# Patient Record
Sex: Female | Born: 1965
Health system: Southern US, Community
[De-identification: ages and names within clinical notes are randomized; demographics above are authoritative.]

## PROBLEM LIST (undated history)

## (undated) DIAGNOSIS — E7800 Pure hypercholesterolemia, unspecified: Secondary | ICD-10-CM

## (undated) DIAGNOSIS — E789 Disorder of lipoprotein metabolism, unspecified: Secondary | ICD-10-CM

## (undated) HISTORY — PX: DILATION AND CURETTAGE OF UTERUS: SHX78

---

## 2008-02-06 ENCOUNTER — Ambulatory Visit (HOSPITAL_COMMUNITY): Admission: RE | Admit: 2008-02-06 | Discharge: 2008-02-06 | Payer: Self-pay | Admitting: Obstetrics and Gynecology

## 2008-02-20 ENCOUNTER — Ambulatory Visit (HOSPITAL_COMMUNITY): Admission: RE | Admit: 2008-02-20 | Discharge: 2008-02-20 | Payer: Self-pay | Admitting: Obstetrics and Gynecology

## 2008-03-12 ENCOUNTER — Ambulatory Visit (HOSPITAL_COMMUNITY): Admission: RE | Admit: 2008-03-12 | Discharge: 2008-03-12 | Payer: Self-pay | Admitting: Obstetrics and Gynecology

## 2008-04-09 ENCOUNTER — Ambulatory Visit (HOSPITAL_COMMUNITY): Admission: RE | Admit: 2008-04-09 | Discharge: 2008-04-09 | Payer: Self-pay | Admitting: Obstetrics and Gynecology

## 2008-05-07 ENCOUNTER — Ambulatory Visit (HOSPITAL_COMMUNITY): Admission: RE | Admit: 2008-05-07 | Discharge: 2008-05-07 | Payer: Self-pay | Admitting: Obstetrics and Gynecology

## 2008-05-25 ENCOUNTER — Inpatient Hospital Stay (HOSPITAL_COMMUNITY): Admission: AD | Admit: 2008-05-25 | Discharge: 2008-05-28 | Payer: Self-pay | Admitting: Obstetrics and Gynecology

## 2008-05-26 ENCOUNTER — Encounter: Payer: Self-pay | Admitting: Obstetrics and Gynecology

## 2009-08-16 ENCOUNTER — Emergency Department (HOSPITAL_BASED_OUTPATIENT_CLINIC_OR_DEPARTMENT_OTHER): Admission: EM | Admit: 2009-08-16 | Discharge: 2009-08-16 | Payer: Self-pay | Admitting: Emergency Medicine

## 2010-01-16 ENCOUNTER — Emergency Department (HOSPITAL_BASED_OUTPATIENT_CLINIC_OR_DEPARTMENT_OTHER)
Admission: EM | Admit: 2010-01-16 | Discharge: 2010-01-16 | Payer: Self-pay | Source: Home / Self Care | Admitting: Emergency Medicine

## 2010-02-01 ENCOUNTER — Encounter: Payer: Self-pay | Admitting: Obstetrics and Gynecology

## 2010-02-02 ENCOUNTER — Encounter: Payer: Self-pay | Admitting: Obstetrics and Gynecology

## 2010-04-21 LAB — CBC
MCHC: 33.8 g/dL (ref 30.0–36.0)
MCV: 86.5 fL (ref 78.0–100.0)
Platelets: 258 10*3/uL (ref 150–400)
Platelets: 271 10*3/uL (ref 150–400)
WBC: 10.9 10*3/uL — ABNORMAL HIGH (ref 4.0–10.5)
WBC: 10.9 10*3/uL — ABNORMAL HIGH (ref 4.0–10.5)

## 2010-04-21 LAB — RPR: RPR Ser Ql: NONREACTIVE

## 2010-07-30 IMAGING — US US OB FOLLOW-UP
1 series · 14 of 28 positions shown · non-contrast
Comparison: none

OBSTETRICAL ULTRASOUND:
 This ultrasound was performed in The [HOSPITAL], and the [facility] report will be stored to [REDACTED] PACS.

[Series 1: us ob follow-up · 14 of 43 slices shown]
[im 2/43]
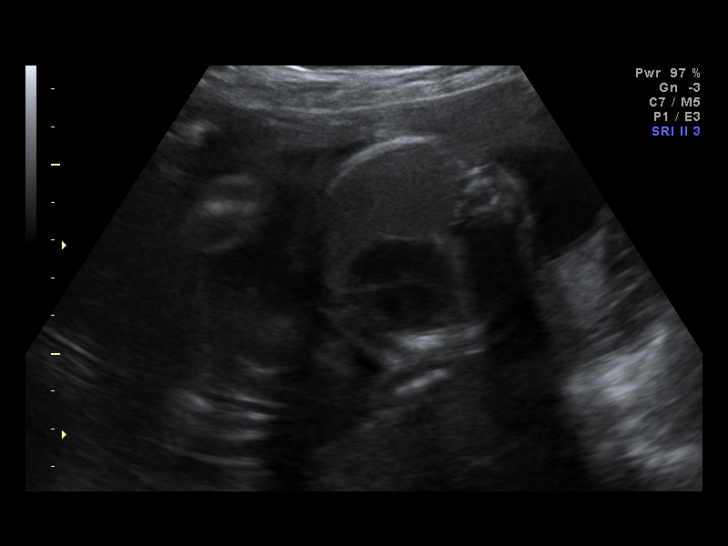
[im 5/43]
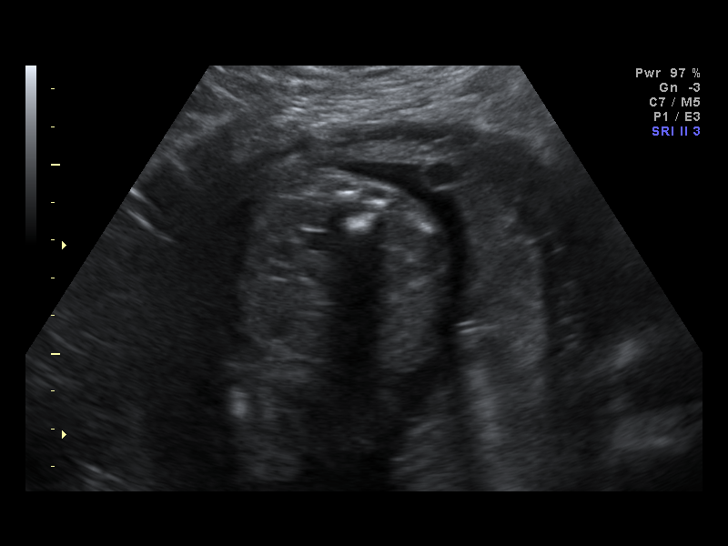
[im 8/43]
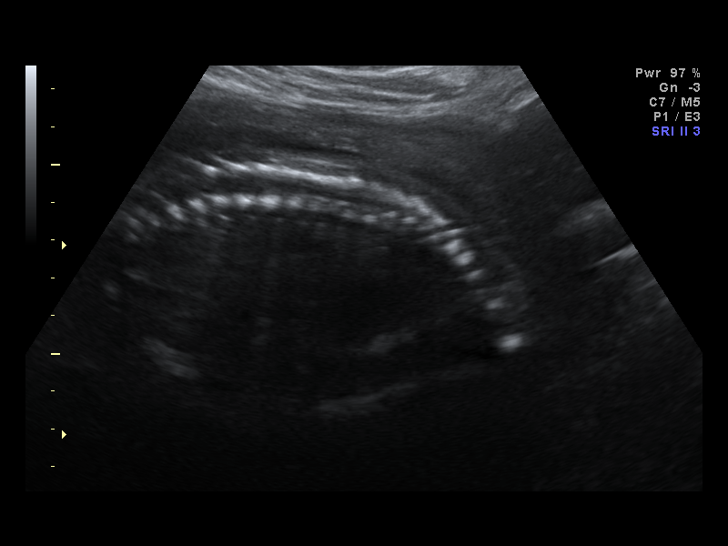
[im 11/43]
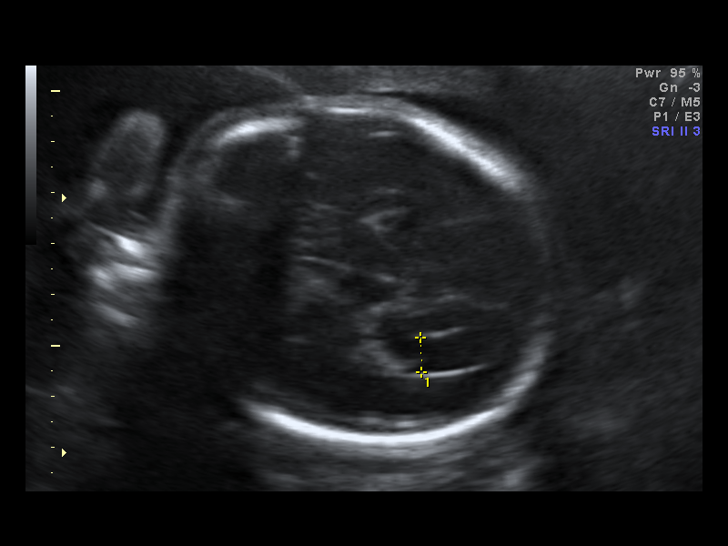
[im 15/43]
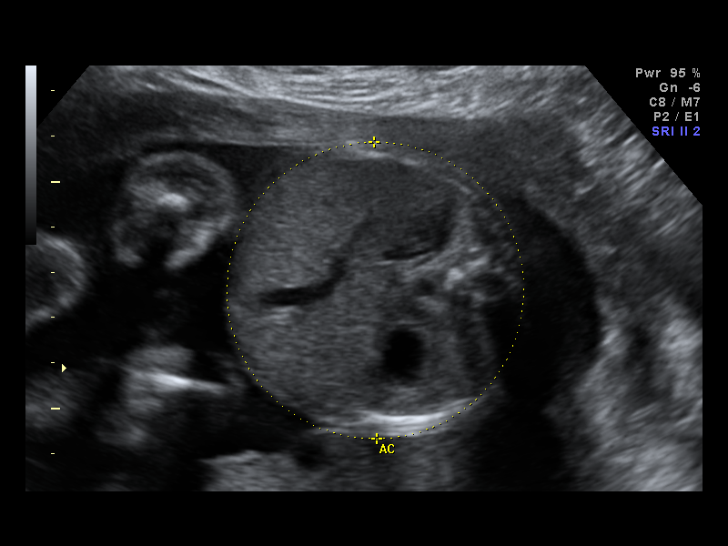
[im 18/43]
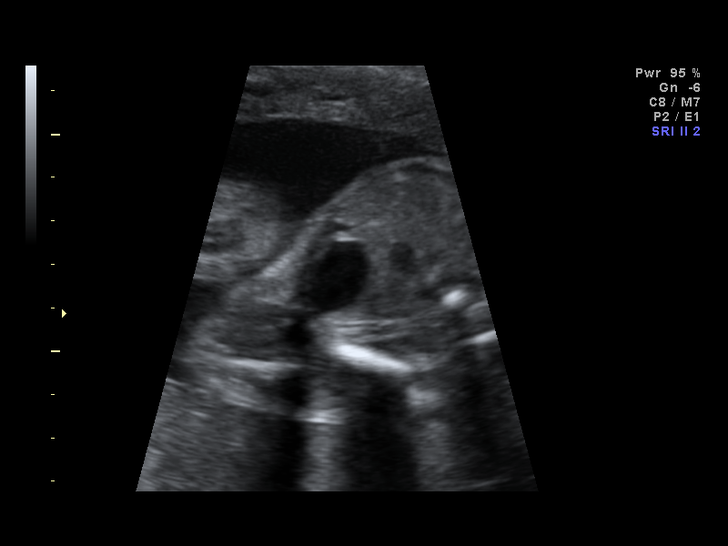
[im 21/43]
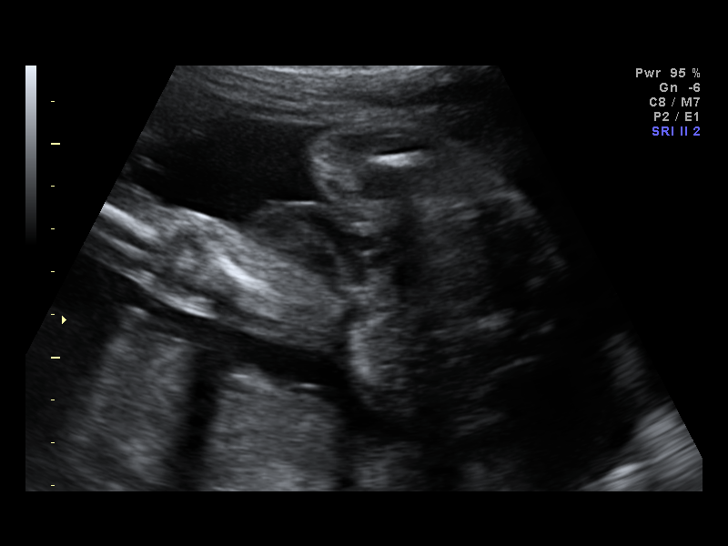
[im 24/43]
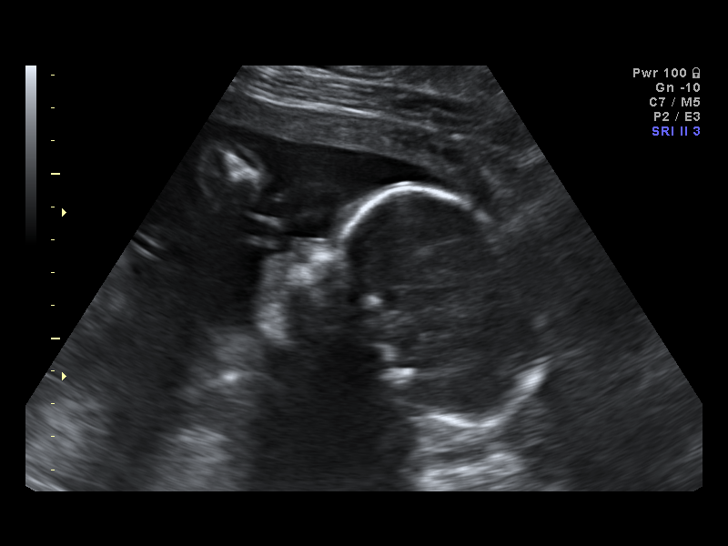
[im 27/43]
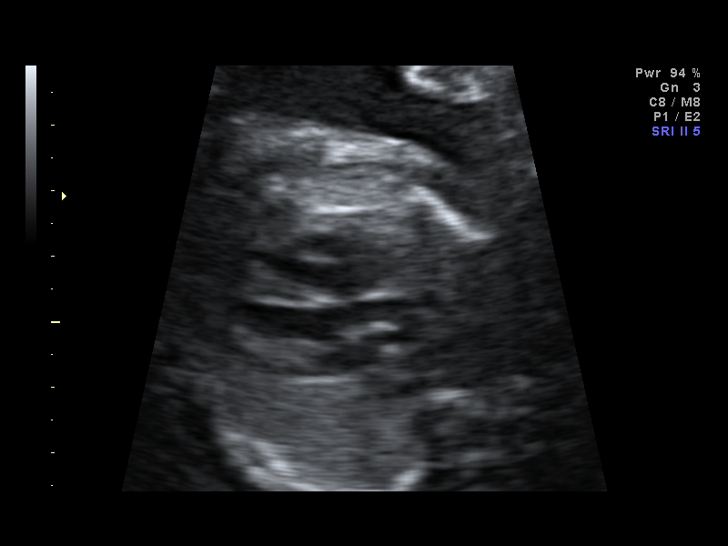
[im 30/43]
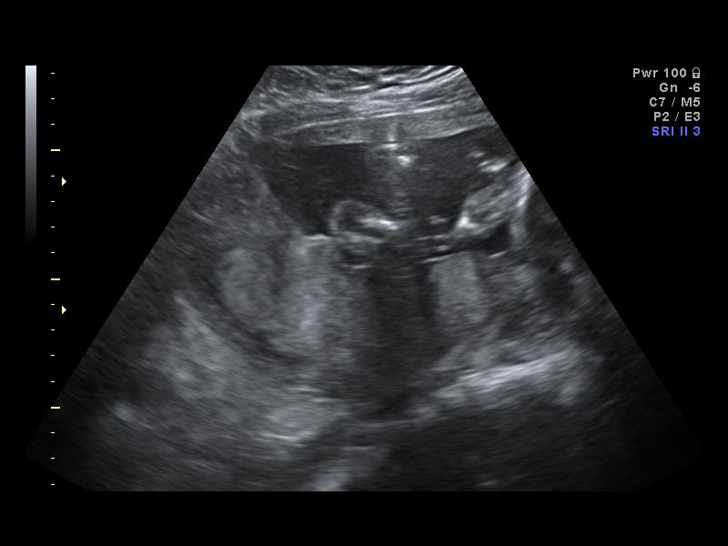
[im 33/43]
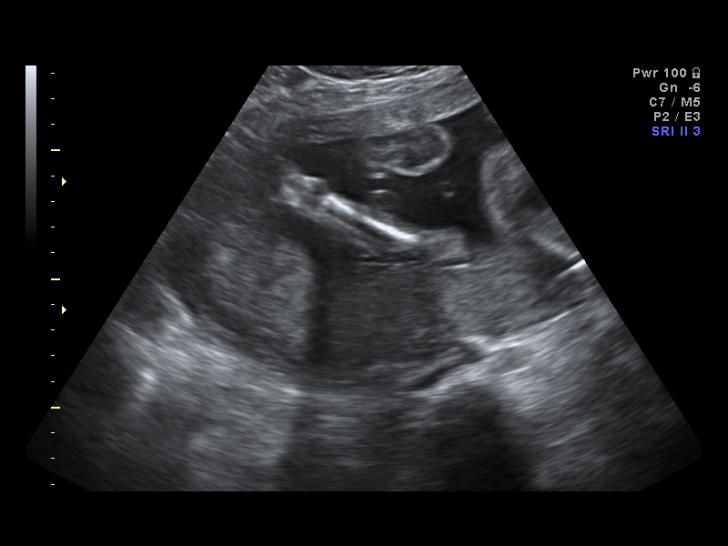
[im 36/43]
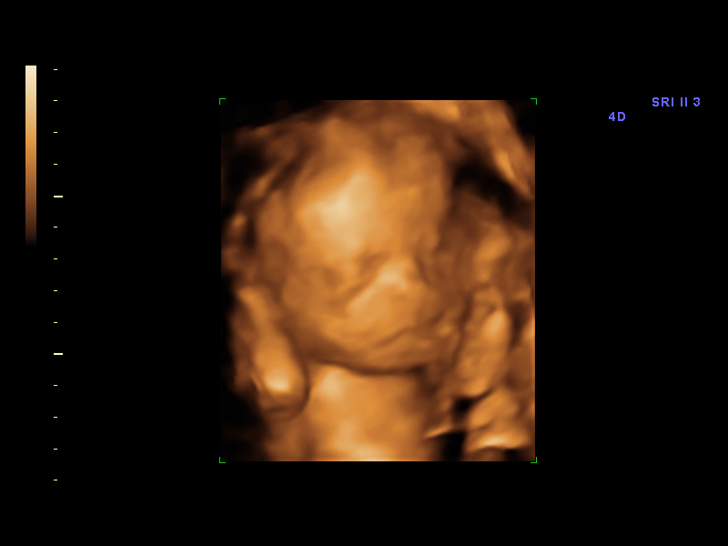
[im 39/43]
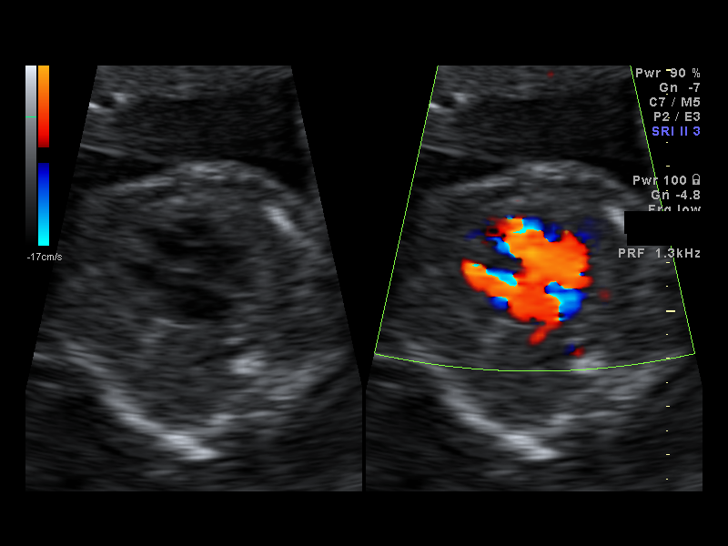
[im 43/43]
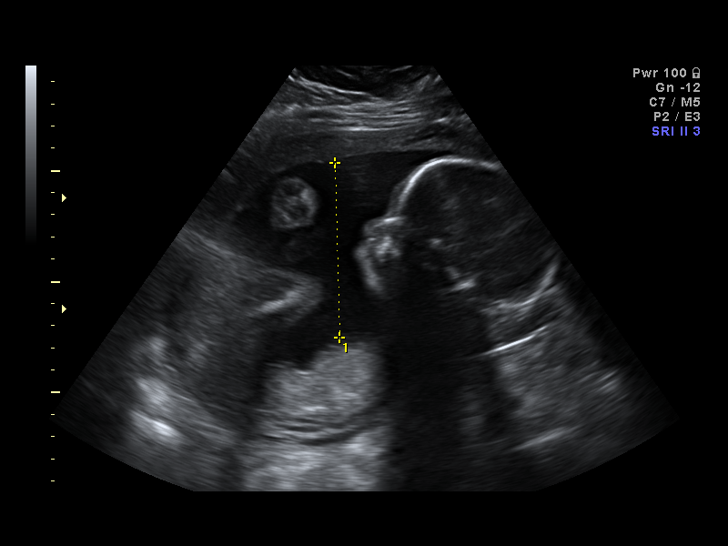

[14 of 28 positions shown; findings below may reference images not displayed]

IMPRESSION: [facility] has also been faxed to the ordering physician.

## 2010-10-30 ENCOUNTER — Inpatient Hospital Stay (HOSPITAL_COMMUNITY): Payer: Medicaid Other

## 2010-10-30 ENCOUNTER — Encounter (HOSPITAL_COMMUNITY): Payer: Self-pay | Admitting: *Deleted

## 2010-10-30 ENCOUNTER — Inpatient Hospital Stay (HOSPITAL_COMMUNITY)
Admission: AD | Admit: 2010-10-30 | Discharge: 2010-10-30 | Disposition: A | Discharge status: Home / Self Care | Payer: Medicaid Other | Source: Ambulatory Visit | Attending: Obstetrics & Gynecology | Admitting: Obstetrics & Gynecology

## 2010-10-30 DIAGNOSIS — N390 Urinary tract infection, site not specified: Secondary | ICD-10-CM

## 2010-10-30 DIAGNOSIS — R1031 Right lower quadrant pain: Secondary | ICD-10-CM | POA: Insufficient documentation

## 2010-10-30 DIAGNOSIS — Z30431 Encounter for routine checking of intrauterine contraceptive device: Secondary | ICD-10-CM | POA: Insufficient documentation

## 2010-10-30 DIAGNOSIS — R1032 Left lower quadrant pain: Secondary | ICD-10-CM | POA: Insufficient documentation

## 2010-10-30 HISTORY — DX: Disorder of lipoprotein metabolism, unspecified: E78.9

## 2010-10-30 HISTORY — DX: Pure hypercholesterolemia, unspecified: E78.00

## 2010-10-30 LAB — URINALYSIS, ROUTINE W REFLEX MICROSCOPIC
Nitrite: NEGATIVE
Specific Gravity, Urine: <1.005 — ABNORMAL LOW (ref 1.005–1.030)
Urobilinogen, UA: 0.2 mg/dL (ref 0.0–1.0)

## 2010-10-30 LAB — POCT PREGNANCY, URINE: Preg Test, Ur: NEGATIVE

## 2010-10-30 LAB — CBC
MCV: 86 fL (ref 78.0–100.0)
Platelets: 240 10*3/uL (ref 150–400)
RDW: 14.3 % (ref 11.5–15.5)
WBC: 7.1 10*3/uL (ref 4.0–10.5)

## 2010-10-30 LAB — WET PREP, GENITAL
Trich, Wet Prep: NONE SEEN
Yeast Wet Prep HPF POC: NONE SEEN

## 2010-10-30 LAB — URINE MICROSCOPIC-ADD ON

## 2010-10-30 MED ORDER — SULFAMETHOXAZOLE-TRIMETHOPRIM 800-160 MG PO TABS: 1.0000 | ORAL_TABLET | Freq: Two times a day (BID) | ORAL | Status: AC

## 2010-10-30 NOTE — ED Notes (Signed)
79 M. Williams CNM in to discuss u/s results and d/c plan

## 2010-10-30 NOTE — Progress Notes (Signed)
Written and verbal d/c instructions given and understanding voiced. 

## 2010-10-30 NOTE — Progress Notes (Signed)
Pt states she has had abdominal pain for about one month. Started on the left lower abdomen but not comes and goes on both sides.

## 2010-10-30 NOTE — Progress Notes (Signed)
States pain is sometimes sharp as well as crampy

## 2010-10-30 NOTE — ED Provider Notes (Signed)
History     Chief Complaint  Patient presents with  . Abdominal Pain   HPI Presents with c/o one month history of intermittent LLQ pain which moves back and forth between RLQ and LLQ.  Describes as sharp and momentary. No pattern or association with diet. Denies constipation or diarrhea. Denies bleeding problems. Has Mirena with light bleeding pattern. Mirena placed by Dr Dion Body, but pt dismissed from practice for nonpayment and has had no exam for 2 years.  States no intercourse for 2 years.    Past Medical History  Diagnosis Date  . Cholesterol serum increased     Past Surgical History  Procedure Date  . Dilation and curettage of uterus   . No past surgeries     History reviewed. No pertinent family history.  History  Substance Use Topics  . Smoking status: Current Everyday Smoker -- 0.5 packs/day  . Smokeless tobacco: Never Used  . Alcohol Use: 0.6 oz/week    1 Glasses of wine per week    Allergies: No Known Allergies  Prescriptions prior to admission  Medication Sig Dispense Refill  . ibuprofen (ADVIL,MOTRIN) 200 MG tablet Take 600 mg by mouth every 6 (six) hours as needed. Takes for pain       . phentermine 37.5 MG capsule Take 37.5 mg by mouth every morning.        . simvastatin (ZOCOR) 20 MG tablet Take 20 mg by mouth at bedtime.          Review of Systems  Constitutional: Negative for fever, chills and malaise/fatigue.  Gastrointestinal: Positive for abdominal pain. Negative for heartburn, nausea, vomiting, diarrhea and constipation.       No anorexia. Is on Phentermine for wt loss  Genitourinary: Negative for dysuria.   Physical Exam   Blood pressure 118/79, pulse 93, temperature 99.1 F (37.3 C), temperature source Oral, resp. rate 20, height 5' 6.75" (1.695 m), weight 239 lb (108.41 kg), last menstrual period 04/30/2010, SpO2 97.00%.  Physical Exam  Constitutional: She is oriented to person, place, and time. She appears well-developed and  well-nourished. No distress.       Talking on cellphone  HENT:  Head: Normocephalic.  Respiratory: Effort normal.  GI: Soft. She exhibits no distension and no mass. There is no tenderness. There is no rebound and no guarding.       Obese abdomen limits exam  Genitourinary: Uterus normal. Vaginal discharge found.       No lesions or bleeding. IUD string visible. Uterus nontender. Adnexae nontender. Obese abdomen limits complete exam.  Neurological: She is alert and oriented to person, place, and time.  Skin: Skin is warm and dry.  Psychiatric: She has a normal mood and affect.   UA:  Large Leuk with some bacteria US Transvaginal Non-ob   10/30/2010  *RADIOLOGY REPORT*  Clinical Data: Intermittent right lower quadrant pain and left lower quadrant pain.  Renal IUD.  LMP unknown.  TRANSABDOMINAL AND TRANSVAGINAL ULTRASOUND OF PELVIS Technique:  Both transabdominal and transvaginal ultrasound examinations of the pelvis were performed. Transabdominal technique was performed for global imaging of the pelvis including uterus, ovaries, adnexal regions, and pelvic cul-de-sac.  Comparison: None.   It was necessary to proceed with endovaginal exam following the transabdominal exam to visualize the ovaries.  Findings:  Uterus: Uterus is retroverted and measures 7.4 x 5.2 x 5.7 cm. Small focal calcifications in the midline uterine fundus are noted, measuring 8 mm maximal diameter.  This could be a small calcified  submucosal fibroid.  Otherwise, the uterus is homogeneous.  Endometrium: Normal in thickness and appearance.  Measures 3 mm. Intrauterine device is satisfactorily positioned in the endometrial canal.  Right ovary:  Normal appearance/no adnexal mass.  Left ovary: Normal appearance/no adnexal mass.  Small focal calcification noted within the left ovary, of doubtful significance.  Other findings: A 1.7 x 1.7 x 1.2 cm simple cyst is noted within the vagina (less likely a somewhat exophytic of the cervical  Nabothian cysts.  When imaging this area the sonographer question the patient effect was painful, and she denied pain in this region. This may be a Gartner duct cyst.  IMPRESSION:  1.  Satisfactory position of intrauterine device. 2.  Small submucosal fundal calcifications may be a small calcified uterine fibroids.intrauterine device. 3.  Ovaries within normal limits. 3.  1.7 cm cyst in the vagina is likely a Gartner's duct cyst. Alternatively, this could be a large benign Nabothian cyst extending from the cervix.  The patient was asymptomatic with imaging this area.  Original Report Authenticated By: Britta Mccreedy, M.D.   MAU Course  Procedures   Assessment and Plan  A:  Intermittent lower abdominal pain Mirena IUD Probably UTI   P:  WIll check CBC, pelvic US  [facility] 10/30/2010, 5:02 PM   Normal ultrasound. Normal CBC Will treat UTI presumptively Discussed findings. Pt to go to primary doctor for GI eval.

## 2010-10-31 NOTE — ED Provider Notes (Signed)
Agree with above note.  LEGGETT,KELLY H. 10/31/2010 5:46 AM

## 2011-09-25 ENCOUNTER — Emergency Department (HOSPITAL_BASED_OUTPATIENT_CLINIC_OR_DEPARTMENT_OTHER): Payer: Medicaid Other

## 2011-09-25 ENCOUNTER — Emergency Department (HOSPITAL_BASED_OUTPATIENT_CLINIC_OR_DEPARTMENT_OTHER)
Admission: EM | Admit: 2011-09-25 | Discharge: 2011-09-25 | Disposition: A | Discharge status: Home / Self Care | Payer: Medicaid Other | Attending: Emergency Medicine | Admitting: Emergency Medicine

## 2011-09-25 ENCOUNTER — Encounter (HOSPITAL_BASED_OUTPATIENT_CLINIC_OR_DEPARTMENT_OTHER): Payer: Self-pay | Admitting: *Deleted

## 2011-09-25 DIAGNOSIS — F17200 Nicotine dependence, unspecified, uncomplicated: Secondary | ICD-10-CM | POA: Insufficient documentation

## 2011-09-25 DIAGNOSIS — S93409A Sprain of unspecified ligament of unspecified ankle, initial encounter: Secondary | ICD-10-CM | POA: Insufficient documentation

## 2011-09-25 DIAGNOSIS — X500XXA Overexertion from strenuous movement or load, initial encounter: Secondary | ICD-10-CM | POA: Insufficient documentation

## 2011-09-25 DIAGNOSIS — Y9239 Other specified sports and athletic area as the place of occurrence of the external cause: Secondary | ICD-10-CM | POA: Insufficient documentation

## 2011-09-25 MED ORDER — HYDROCODONE-ACETAMINOPHEN 5-325 MG PO TABS: 2.0000 | ORAL_TABLET | ORAL | Status: AC | PRN

## 2011-09-25 MED ORDER — HYDROCODONE-ACETAMINOPHEN 5-325 MG PO TABS
2.0000 | ORAL_TABLET | Freq: Once | ORAL | Status: AC
  Administered 2011-09-25: 2 via ORAL
  Filled 2011-09-25: qty 2

## 2011-09-25 NOTE — ED Provider Notes (Signed)
History     CSN: [id number]  Arrival date & time 09/25/11  1945   First MD Initiated Contact with Patient 09/25/11 2118      Chief Complaint  Patient presents with  . Ankle Injury    (Consider location/radiation/quality/duration/timing/severity/associated sxs/prior treatment) Patient is a 46 y.o. female presenting with ankle pain. The history is provided by the patient. No language interpreter was used.  Ankle Pain  The incident occurred less than 1 hour ago. [place] The injury mechanism was torsion. The pain is present in the right ankle. The quality of the pain is described as aching. The pain is at a severity of 6/10. The pain is moderate. The pain has been constant since onset. Pertinent negatives include no inability to bear weight. She has tried nothing for the symptoms. The treatment provided no relief.  [place] and [place].    Past Medical History  Diagnosis Date  . Cholesterol serum increased     Past Surgical History  Procedure Date  . Dilation and curettage of uterus   . No past surgeries     History reviewed. No pertinent family history.  History  Substance Use Topics  . Smoking status: Current Every Day Smoker -- 0.5 packs/day  . Smokeless tobacco: Never Used  . Alcohol Use: 0.6 oz/week    1 Glasses of wine per week    OB History    Grav Para Term Preterm Abortions TAB SAB Ect Mult Living   3 2 2  1  0 1  1 3       Review of Systems  Musculoskeletal: Positive for joint swelling.  All other systems reviewed and are negative.    Allergies  Review of patient's allergies indicates no known allergies.  Home Medications   Current Outpatient Rx  Name Route Sig Dispense Refill  . IBUPROFEN 200 MG PO TABS Oral Take 400 mg by mouth every 6 (six) hours as needed. Takes for pain      BP 114/79  Pulse 76  Temp 98.1 F (36.7 C) (Oral)  Resp 20  Ht 5' 6.5" (1.689 m)  Wt 238 lb  (107.956 kg)  BMI 37.84 kg/m2  SpO2 100%  Physical Exam  Vitals reviewed. Constitutional: She appears well-developed and well-nourished.  HENT:  Head: Normocephalic and atraumatic.  Musculoskeletal: She exhibits tenderness.       Tender right ankle, swollen,  nv and ns intact  Skin: Skin is warm.    ED Course  Procedures (including critical care time)  Labs Reviewed - No data to display No results found.   No diagnosis found.    MDM  Pt placed in aso and on crutches.   I advised see Dr. Pearletha Forge for recheck in 1 week        Lonia Skinner [place], [place] 09/25/11 2205

## 2011-09-25 NOTE — ED Notes (Signed)
Patient transported to X-ray 

## 2011-09-25 NOTE — ED Notes (Signed)
Pt states she turned her right ankle earlier today. Swelling noted. +dpp palp. Moves toes. Feels touch. Cap refill < 3 sec Ice applied.

## 2011-09-25 NOTE — ED Notes (Signed)
Returned from xray

## 2011-09-26 NOTE — ED Provider Notes (Signed)
Medical screening examination/treatment/procedure(s) were performed by non-physician practitioner and as supervising physician I was immediately available for consultation/collaboration.  Zachory Mangual, MD 09/26/11 0051 

## 2011-10-05 ENCOUNTER — Ambulatory Visit: Payer: Medicaid Other | Admitting: Family Medicine

## 2011-10-08 ENCOUNTER — Ambulatory Visit: Payer: Medicaid Other | Admitting: Family Medicine

## 2011-10-11 ENCOUNTER — Ambulatory Visit (INDEPENDENT_AMBULATORY_CARE_PROVIDER_SITE_OTHER): Payer: Medicaid Other | Admitting: Family Medicine

## 2011-10-11 ENCOUNTER — Encounter: Payer: Self-pay | Admitting: Family Medicine

## 2011-10-11 VITALS — BP 107/77 | HR 86 | Ht 67.0 in | Wt 238.0 lb

## 2011-10-11 DIAGNOSIS — S99911A Unspecified injury of right ankle, initial encounter: Secondary | ICD-10-CM

## 2011-10-11 DIAGNOSIS — S8990XA Unspecified injury of unspecified lower leg, initial encounter: Secondary | ICD-10-CM

## 2011-10-11 NOTE — Patient Instructions (Addendum)
You have an ankle sprain. Ice the area for 15 minutes at a time, 3-4 times a day as needed. Take aleve 1-2 tabs twice a day with food for 1 week then as needed. Norco every 6 hours as needed for pain - no driving on this medication. Elevate above the level of your heart when possible Activities as tolerated. Wear ankle brace as much as possible when up and walking around for next 4 weeks then as needed. Come out of the brace twice a day to do Up/down and alphabet exercises 2-3 sets of each. Theraband exercises 3 sets of 10 each direction - start these in 1 week. Consider physical therapy for strengthening and balance exercises if not improving as expected. If not improving as expected, we may repeat x-rays or consider further testing like an MRI. Follow up with me in 4 weeks or as needed.

## 2011-10-12 DIAGNOSIS — S99911A Unspecified injury of right ankle, initial encounter: Secondary | ICD-10-CM | POA: Insufficient documentation

## 2011-10-13 ENCOUNTER — Encounter: Payer: Self-pay | Admitting: Family Medicine

## 2011-10-13 NOTE — Progress Notes (Signed)
  Subjective:    Patient ID: Shelly Washington, female    DOB: 24-Jun-1965, 46 y.o.   MRN: [id number]  PCP: Dr. Julio Sicks  HPI 46 yo F here for right ankle injury.  Patient reports [place] 9[place]16 [place] - walking from car she inverted her right ankle. Difficulty bearing weight after this - stayed on ground for about 10 minutes. + swelling, some bruising. No prior ankle injuries. Went to ED and had x-rays that were negative - given crutches and ASO. Given vicodin and has been using ice pack. Has improved some but still cannot jog.  Past Medical History  Diagnosis Date  . Cholesterol serum increased     Current Outpatient Prescriptions on File Prior to Visit  Medication Sig Dispense Refill  . ibuprofen (ADVIL,MOTRIN) 200 MG tablet Take 400 mg by mouth every 6 (six) hours as needed. Takes for pain        Past Surgical History  Procedure Date  . Dilation and curettage of uterus     No Known Allergies  History   Social History  . Marital Status: Legally Separated    Spouse Name: N/A    Number of Children: N/A  . Years of Education: N/A   Occupational History  . Not on file.   Social History Main Topics  . Smoking status: Current Every Day Smoker -- 0.5 packs/day  . Smokeless tobacco: Never Used  . Alcohol Use: 0.6 oz/week    1 Glasses of wine per week  . Drug Use: No  . Sexually Active: No   Other Topics Concern  . Not on file   Social History Narrative  . No narrative on file    Family History  Problem Relation Age of Onset  . Hypertension Mother   . Diabetes Neg Hx   . Heart attack Neg Hx   . Hyperlipidemia Neg Hx   . Sudden death Neg Hx     BP 107/77  Pulse 86  Ht 5\' 7"  (1.702 m)  Wt 238 lb (107.956 kg)  BMI 37.28 kg/m2  Review of Systems See HPI above.    Objective:   Physical Exam Gen: NAD  R ankle: Mild swelling laterally.  No bruising, other deformity. FROM with 5/5 strength all directions without pain. TTP at ATFL  mildly.  No malleolar, base 5th, navicular, fibular head, other foot/ankle TTP. Trace ant drawer and talar tilt. Negative syndesmotic compression. Thompsons test negative. NV intact distally.    Assessment & Plan:  1. Right ankle sprain - Reassured.  Radiographs negative.  Icing, nsaids with norco as needed - rx #40 for evening and bedtime as needed for severe pain.  Continue aso with ambulation.  Start ROM and theraband strengthening exercises - handout provided.  See instructions for further.  F/u in 4 weeks or prn.

## 2011-10-13 NOTE — Assessment & Plan Note (Signed)
Right ankle sprain - Reassured.  Radiographs negative.  Icing, nsaids with norco as needed - rx #40 for evening and bedtime as needed for severe pain.  Continue aso with ambulation.  Start ROM and theraband strengthening exercises - handout provided.  See instructions for further.  F/u in 4 weeks or prn.

## 2013-11-12 ENCOUNTER — Encounter: Payer: Self-pay | Admitting: Family Medicine

## 2015-05-22 ENCOUNTER — Emergency Department (HOSPITAL_BASED_OUTPATIENT_CLINIC_OR_DEPARTMENT_OTHER)
Admission: EM | Admit: 2015-05-22 | Discharge: 2015-05-22 | Disposition: A | Discharge status: Home / Self Care | Payer: Commercial Managed Care - HMO | Attending: Emergency Medicine | Admitting: Emergency Medicine

## 2015-05-22 ENCOUNTER — Encounter (HOSPITAL_BASED_OUTPATIENT_CLINIC_OR_DEPARTMENT_OTHER): Payer: Self-pay

## 2015-05-22 ENCOUNTER — Emergency Department (HOSPITAL_BASED_OUTPATIENT_CLINIC_OR_DEPARTMENT_OTHER): Payer: Commercial Managed Care - HMO

## 2015-05-22 DIAGNOSIS — M545 Low back pain, unspecified: Secondary | ICD-10-CM

## 2015-05-22 DIAGNOSIS — M549 Dorsalgia, unspecified: Secondary | ICD-10-CM | POA: Diagnosis present

## 2015-05-22 DIAGNOSIS — F17200 Nicotine dependence, unspecified, uncomplicated: Secondary | ICD-10-CM | POA: Diagnosis not present

## 2015-05-22 LAB — COMPREHENSIVE METABOLIC PANEL
ALBUMIN: 3.9 g/dL (ref 3.5–5.0)
ALK PHOS: 56 U/L (ref 38–126)
ALT: 10 U/L — AB (ref 14–54)
AST: 15 U/L (ref 15–41)
Anion gap: 2 — ABNORMAL LOW (ref 5–15)
BILIRUBIN TOTAL: 0.5 mg/dL (ref 0.3–1.2)
BUN: 7 mg/dL (ref 6–20)
CO2: 27 mmol/L (ref 22–32)
CREATININE: 0.85 mg/dL (ref 0.44–1.00)
Calcium: 8.6 mg/dL — ABNORMAL LOW (ref 8.9–10.3)
Chloride: 105 mmol/L (ref 101–111)
GFR calc Af Amer: >60 mL/min (ref >60)
GFR calc non Af Amer: >60 mL/min (ref >60)
GLUCOSE: 83 mg/dL (ref 65–99)
POTASSIUM: 3.7 mmol/L (ref 3.5–5.1)
Sodium: 134 mmol/L — ABNORMAL LOW (ref 135–145)
TOTAL PROTEIN: 7 g/dL (ref 6.5–8.1)

## 2015-05-22 LAB — CBC WITH DIFFERENTIAL/PLATELET
BASOS ABS: 0 10*3/uL (ref 0.0–0.1)
Basophils Relative: 1 %
EOS ABS: 0.1 10*3/uL (ref 0.0–0.7)
EOS PCT: 1 %
HCT: 39.1 % (ref 36.0–46.0)
Hemoglobin: 12.6 g/dL (ref 12.0–15.0)
LYMPHS PCT: 41 %
Lymphs Abs: 2.4 10*3/uL (ref 0.7–4.0)
MCH: 27.7 pg (ref 26.0–34.0)
MCHC: 32.2 g/dL (ref 30.0–36.0)
MCV: 85.9 fL (ref 78.0–100.0)
MONO ABS: 0.4 10*3/uL (ref 0.1–1.0)
Monocytes Relative: 6 %
Neutro Abs: 3 10*3/uL (ref 1.7–7.7)
Neutrophils Relative %: 51 %
PLATELETS: 256 10*3/uL (ref 150–400)
RBC: 4.55 MIL/uL (ref 3.87–5.11)
RDW: 14.1 % (ref 11.5–15.5)
WBC: 5.9 10*3/uL (ref 4.0–10.5)

## 2015-05-22 LAB — URINALYSIS, ROUTINE W REFLEX MICROSCOPIC
BILIRUBIN URINE: NEGATIVE
Glucose, UA: NEGATIVE mg/dL
KETONES UR: NEGATIVE mg/dL
Leukocytes, UA: NEGATIVE
NITRITE: NEGATIVE
PROTEIN: NEGATIVE mg/dL
Specific Gravity, Urine: 1.005 (ref 1.005–1.030)
pH: 6.5 (ref 5.0–8.0)

## 2015-05-22 LAB — URINE MICROSCOPIC-ADD ON

## 2015-05-22 LAB — PREGNANCY, URINE: PREG TEST UR: NEGATIVE

## 2015-05-22 NOTE — Discharge Instructions (Signed)
The cause of your back pain was not identified today. You had a CT scan performed that demonstrated gallstones or gallbladder. You can take ibuprofen, available over-the-counter as needed for pain.   Back Pain, Adult Back pain is very common in adults.The cause of back pain is rarely dangerous and the pain often gets better over time.The cause of your back pain may not be known. Some common causes of back pain include:  Strain of the muscles or ligaments supporting the spine.  Wear and tear (degeneration) of the spinal disks.  Arthritis.  Direct injury to the back. For many people, back pain may return. Since back pain is rarely dangerous, most people can learn to manage this condition on their own. HOME CARE INSTRUCTIONS Watch your back pain for any changes. The following actions may help to lessen any discomfort you are feeling:  Remain active. It is stressful on your back to sit or stand in one place for long periods of time. Do not sit, drive, or stand in one place for more than 30 minutes at a time. Take short walks on even surfaces as soon as you are able.Try to increase the length of time you walk each day.  Exercise regularly as directed by your health care provider. Exercise helps your back heal faster. It also helps avoid future injury by keeping your muscles strong and flexible.  Do not stay in bed.Resting more than 1-2 days can delay your recovery.  Pay attention to your body when you bend and lift. The most comfortable positions are those that put less stress on your recovering back. Always use proper lifting techniques, including:  Bending your knees.  Keeping the load close to your body.  Avoiding twisting.  Find a comfortable position to sleep. Use a firm mattress and lie on your side with your knees slightly bent. If you lie on your back, put a pillow under your knees.  Avoid feeling anxious or stressed.Stress increases muscle tension and can worsen back  pain.It is important to recognize when you are anxious or stressed and learn ways to manage it, such as with exercise.  Take medicines only as directed by your health care provider. Over-the-counter medicines to reduce pain and inflammation are often the most helpful.Your health care provider may prescribe muscle relaxant drugs.These medicines help dull your pain so you can more quickly return to your normal activities and healthy exercise.  Apply ice to the injured area:  Put ice in a plastic bag.  Place a towel between your skin and the bag.  Leave the ice on for 20 minutes, 2-3 times a day for the first 2-3 days. After that, ice and heat may be alternated to reduce pain and spasms.  Maintain a healthy weight. Excess weight puts extra stress on your back and makes it difficult to maintain good posture. SEEK MEDICAL CARE IF:  You have pain that is not relieved with rest or medicine.  You have increasing pain going down into the legs or buttocks.  You have pain that does not improve in one week.  You have night pain.  You lose weight.  You have a fever or chills. SEEK IMMEDIATE MEDICAL CARE IF:   You develop new bowel or bladder control problems.  You have unusual weakness or numbness in your arms or legs.  You develop nausea or vomiting.  You develop abdominal pain.  You feel faint.   This information is not intended to replace advice given to you by your  health care provider. Make sure you discuss any questions you have with your health care provider.   Document Released: 12/28/2004 Document Revised: 01/18/2014 Document Reviewed: 05/01/2013 Elsevier Interactive Patient Education [employer].

## 2015-05-22 NOTE — ED Provider Notes (Signed)
CSN: [id number]     Arrival date & time 05/22/15  1522 History   First MD Initiated Contact with Patient 05/22/15 1532     Chief Complaint  Patient presents with  . Back Pain    Patient is a 50 y.o. female presenting with back pain. The history is provided by the patient. No language interpreter was used.  Back Pain  Shelly Washington is a 50 y.o. female who presents to the Emergency Department complaining of back pain. She reports 3 days of bilateral mid back pain. The pain initially felt like a pulled muscle but since then it is been a constant discomfort. She has associated nausea. There is no change with any activities or movement. No fevers, vomiting, dysuria, abdominal pain, diarrhea, numbness, weakness. She has a history of duplicated left ureter.  Symptoms are moderate and constant in nature. She has not tried any medications for her symptoms.  Past Medical History  Diagnosis Date  . Cholesterol serum increased    Past Surgical History  Procedure Laterality Date  . Dilation and curettage of uterus     Family History  Problem Relation Age of Onset  . Hypertension Mother   . Diabetes Neg Hx   . Heart attack Neg Hx   . Hyperlipidemia Neg Hx   . Sudden death Neg Hx    Social History  Substance Use Topics  . Smoking status: Current Every Day Smoker -- 0.50 packs/day  . Smokeless tobacco: Never Used  . Alcohol Use: Yes     Comment: social   OB History    Gravida Para Term Preterm AB TAB SAB Ectopic Multiple Living   0 Review of Systems  Musculoskeletal: Positive for back pain.  All other systems reviewed and are negative.     Allergies  Review of patient's allergies indicates no known allergies.  Home Medications   Prior to Admission medications   Not on File   BP 120/88 mmHg  Pulse 80  Temp(Src) 99 F (37.2 C) (Oral)  Resp 18  Ht  (1.676 m)  Wt 244 lb (110.678 kg)  BMI 39.40 kg/m2  SpO2 100% Physical Exam  Constitutional:  She is oriented to person, place, and time. She appears well-developed and well-nourished.  HENT:  Head: Normocephalic and atraumatic.  Cardiovascular: Normal rate and regular rhythm.   No murmur heard. Pulmonary/Chest: Effort normal and breath sounds normal. No respiratory distress.  Abdominal: Soft. There is no tenderness. There is no rebound and no guarding.  No CVA tenderness  Musculoskeletal: She exhibits no edema or tenderness.  Neurological: She is alert and oriented to person, place, and time.  Skin: Skin is warm and dry.  Psychiatric: She has a normal mood and affect. Her behavior is normal.  Nursing note and vitals reviewed.   ED Course  Procedures (including critical care time) Labs Review Labs Reviewed  URINALYSIS, ROUTINE W REFLEX MICROSCOPIC (NOT AT [facility]) - Abnormal; Notable for the following:    Hgb urine dipstick MODERATE (*)    All other components within normal limits  URINE MICROSCOPIC-ADD ON - Abnormal; Notable for the following:    Squamous Epithelial / LPF 0-5 (*)    Bacteria, UA FEW (*)    All other components within normal limits  COMPREHENSIVE METABOLIC PANEL - Abnormal; Notable for the following:    Sodium 134 (*)    Calcium 8.6 (*)    ALT 10 (*)  Anion gap 2 (*)    All other components within normal limits  PREGNANCY, URINE  CBC WITH DIFFERENTIAL/PLATELET    Imaging Review Ct Renal Stone Study  05/22/2015  CLINICAL DATA:  Left flank pain.  Hypercholesterolemia EXAM: CT ABDOMEN AND PELVIS WITHOUT CONTRAST TECHNIQUE: Multidetector CT imaging of the abdomen and pelvis was performed following the standard protocol without IV contrast. COMPARISON:  None. FINDINGS: Lower chest and abdominal wall:  No contributory findings. Hepatobiliary: Tiny subcapsular cyst or other low-density lesion along the right liver tip.Gallbladder is full of calculi without signs of obstruction or inflammation. Pancreas: Unremarkable. Spleen: Unremarkable. Adrenals/Urinary Tract:  Negative adrenals. Duplicated collecting system on the left with atrophy of the upper pole moiety. There is an ovoid cyst along the upper vagina which is likely a Gartner cyst, no visible continuity with the upper pole moiety to suggest obstructed ectopic ureter. The distal left ureter lower pole moiety is dilated. Unremarkable bladder. Reproductive:IUD in good position. Stomach/Bowel:  No obstruction. No appendicitis. Vascular/Lymphatic: No acute vascular abnormality. No mass or adenopathy. Peritoneal: No ascites or pneumoperitoneum. Musculoskeletal: Focally advanced disc degeneration at L5-S1. IMPRESSION: 1. No acute finding. 2. 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 There is associated dilatation of the left lower moiety ureter and a vaginal cyst. 3. Cholelithiasis. Electronically Signed   By: Marnee SpringJonathon  Watts M.D.   On: 05/22/2015 17:01   I have personally reviewed and evaluated these images and lab results as part of my medical decision-making.   EKG Interpretation None      MDM   Final diagnoses:  Bilateral low back pain without sciatica  Patient here for evaluation of bilateral mid back pain. No recent injuries. She is in no distress on examination. No evidence of UTI, renal injury, renal colic. Presentation is not consistent with cauda equina or epidural abscess. Discussed with patient home care for back pain of unclear etiology. Recommend rest, ibuprofen, apply heat as needed. Return precautions were discussed.    Tilden FossaElizabeth Terricka Onofrio, MD 05/22/15 1750

## 2015-05-22 NOTE — ED Notes (Signed)
C/o mid/lower back pain x 3 days-denies injury-NAD-steady gait

## 2016-07-11 ENCOUNTER — Emergency Department (HOSPITAL_BASED_OUTPATIENT_CLINIC_OR_DEPARTMENT_OTHER)
Admission: EM | Admit: 2016-07-11 | Discharge: 2016-07-11 | Disposition: A | Discharge status: Home / Self Care | Payer: 59 | Attending: Emergency Medicine | Admitting: Emergency Medicine

## 2016-07-11 ENCOUNTER — Encounter (HOSPITAL_BASED_OUTPATIENT_CLINIC_OR_DEPARTMENT_OTHER): Payer: Self-pay | Admitting: *Deleted

## 2016-07-11 DIAGNOSIS — F17200 Nicotine dependence, unspecified, uncomplicated: Secondary | ICD-10-CM | POA: Diagnosis not present

## 2016-07-11 DIAGNOSIS — L03116 Cellulitis of left lower limb: Secondary | ICD-10-CM

## 2016-07-11 DIAGNOSIS — M79662 Pain in left lower leg: Secondary | ICD-10-CM | POA: Diagnosis present

## 2016-07-11 MED ORDER — SULFAMETHOXAZOLE-TRIMETHOPRIM 800-160 MG PO TABS
1.0000 | ORAL_TABLET | Freq: Once | ORAL | Status: AC
  Administered 2016-07-11: 1 via ORAL
  Filled 2016-07-11: qty 1

## 2016-07-11 MED ORDER — SULFAMETHOXAZOLE-TRIMETHOPRIM 800-160 MG PO TABS: 1.0000 | ORAL_TABLET | Freq: Two times a day (BID) | ORAL | 0 refills | Status: AC

## 2016-07-11 NOTE — ED Provider Notes (Signed)
[facility] Provider Note   CSN: [id number] Arrival date & time: 07/11/16  2013  By signing my name below, I, Deland PrettySherilynn Knight, attest that this documentation has been prepared under the direction and in the presence of Renne CriglerJoshua Rudransh Bellanca, PA-C. Electronically Signed: Deland PrettySherilynn Knight, ED Scribe. 07/11/16. 8:33 PM.  History   Chief Complaint Chief Complaint  Patient presents with  . Insect Bite   The history is provided by the patient. No language interpreter was used.   HPI Comments: Shelly Washington is a 51 y.o. female who presents to the Emergency Department complaining of moderate left calf pain that began yesterday. The pt has associated warmth, itching, swelling, and erythema. The pt believes she may have been bit while in working in a house in [place]. The pt has applied antibiotic cream, cleaned it with alcohol, and applied a cool compress. She also took a percocet for her pain, with some relief. The pt also notes an abrasion that occurred a few weeks ago from a mechanical fall. The pt denies fever.  Past Medical History:  Diagnosis Date  . Cholesterol serum increased     Patient Active Problem List   Diagnosis Date Noted  . Right ankle injury 10/12/2011    Past Surgical History:  Procedure Laterality Date  . DILATION AND CURETTAGE OF UTERUS      OB History    Gravida Para Term Preterm AB Living   3 2 2   1 3    SAB TAB Ectopic Multiple Live Births   1 0   1         Home Medications    Prior to Admission medications   Not on File    Family History Family History  Problem Relation Age of Onset  . Hypertension Mother   . Diabetes Neg Hx   . Heart attack Neg Hx   . Hyperlipidemia Neg Hx   . Sudden death Neg Hx     Social History Social History  Substance Use Topics  . Smoking status: Current Every Day Smoker    Packs/day: 0.50  . Smokeless tobacco: Never Used  . Alcohol use Yes     Comment: social     Allergies   Patient has no known  allergies.   Review of Systems Review of Systems  Constitutional: Negative for fever.  Gastrointestinal: Negative for nausea and vomiting.  Skin: Positive for color change and wound.  Hematological: Negative for adenopathy.     Physical Exam Updated Vital Signs BP 117/69 (BP Location: Left Arm)   Pulse 74   Temp 98.9 F (37.2 C) (Oral)   Resp 20   Ht 5\' 6"  (1.676 m)   Wt 234 lb (106.1 kg)   SpO2 100%   BMI 37.77 kg/m   Physical Exam  Constitutional: She appears well-developed and well-nourished.  HENT:  Head: Normocephalic.  Eyes: EOM are normal.  Neck: Normal range of motion.  Pulmonary/Chest: Effort normal.  Abdominal: She exhibits no distension.  Musculoskeletal: Normal range of motion.  Neurological: She is alert.  Skin:  15 cm diameter area of cellulitis to the left anterior lower leg with small central pustule.  Psychiatric: She has a normal mood and affect.  Nursing note and vitals reviewed.    ED Treatments / Results   DIAGNOSTIC STUDIES: Oxygen Saturation is 100% on RA, normal by my interpretation.   COORDINATION OF CARE: 8:29 PM-Discussed next steps with pt including Bactrim, warm compress, and elevation. Pt verbalized understanding and is  agreeable with the plan.   Labs (all labs ordered are listed, but only abnormal results are displayed) Labs Reviewed - No data to display  EKG  EKG Interpretation None       Radiology No results found.  Procedures Procedures (including critical care time)  Medications Ordered in ED Medications - No data to display   Initial Impression / Assessment and Plan / ED Course  I have reviewed the triage vital signs and the nursing notes.  Pertinent labs & imaging results that were available during my care of the patient were reviewed by me and considered in my medical decision making (see chart for details).     Pt urged to return with worsening pain, worsening swelling, expanding area of redness or  streaking up extremity, fever, or any other concerns. Urged to take complete course of antibiotics as prescribed. Pt verbalizes understanding and agrees with plan.   Final Clinical Impressions(s) / ED Diagnoses   Final diagnoses:  Cellulitis of left lower extremity   Patient with cellulitis of left lower extremity. No palpable induration or abscess pocket noted. No systemic signs or symptoms. Patient appears well, nontoxic. Will treat with antibiotics, warm compresses, elevation. Return instructions as above.  New Prescriptions Discharge Medication List as of 07/11/2016  8:40 PM    START taking these medications   Details  sulfamethoxazole-trimethoprim (BACTRIM DS,SEPTRA DS) 800-160 MG tablet Take 1 tablet by mouth 2 (two) times daily., Starting Sun 07/11/2016, Until Sun 07/18/2016, Print       I personally performed the services described in this documentation, which was scribed in my presence. The recorded information has been reviewed and is accurate.     Renne Crigler, PA-C 07/12/16 0030    Alvira Monday, MD 07/13/16 1318

## 2016-07-11 NOTE — Discharge Instructions (Signed)
Please read and follow all provided instructions.  Your diagnoses today include:  1. Cellulitis of left lower extremity     Tests performed today include:  Vital signs. See below for your results today.   Medications prescribed:   Bactrim (trimethoprim/sulfamethoxazole) - antibiotic  You have been prescribed an antibiotic medicine: take the entire course of medicine even if you are feeling better. Stopping early can cause the antibiotic not to work.  Take any prescribed medications only as directed.   Home care instructions:  Follow any educational materials contained in this packet. Keep affected area above the level of your heart when possible. Wash area gently twice a day with warm soapy water. Do not apply alcohol or hydrogen peroxide. Cover the area if it draining or weeping.   Follow-up instructions: Return to the Emergency Department in 48 hours for a recheck if your symptoms are not significantly improved.   Return instructions:  Return to the Emergency Department if you have:  Fever  Worsening symptoms  Worsening pain  Worsening swelling  Redness of the skin that moves away from the affected area, especially if it streaks away from the affected area   Any other emergent concerns  Your vital signs today were: BP 117/69 (BP Location: Left Arm)    Pulse 74    Temp 98.9 F (37.2 C) (Oral)    Resp 20    Ht 5\' 6"  (1.676 m)    Wt 106.1 kg (234 lb)    SpO2 100%    BMI 37.77 kg/m  If your blood pressure (BP) was elevated above 135/85 this visit, please have this repeated by your doctor within one month. --------------

## 2016-07-11 NOTE — ED Triage Notes (Signed)
Pt states that she was traveling yesterday, thinks she may have been bit by a bug and while in the car she noticed an area on her leg that was itching. She now has an area that that is red, warm, and having clear drainage on the left outer leg.

## 2016-07-15 ENCOUNTER — Encounter (HOSPITAL_BASED_OUTPATIENT_CLINIC_OR_DEPARTMENT_OTHER): Payer: Self-pay | Admitting: Emergency Medicine

## 2016-07-15 ENCOUNTER — Emergency Department (HOSPITAL_BASED_OUTPATIENT_CLINIC_OR_DEPARTMENT_OTHER)
Admission: EM | Admit: 2016-07-15 | Discharge: 2016-07-15 | Disposition: A | Discharge status: Home / Self Care | Payer: 59 | Attending: Emergency Medicine | Admitting: Emergency Medicine

## 2016-07-15 DIAGNOSIS — M79662 Pain in left lower leg: Secondary | ICD-10-CM | POA: Diagnosis present

## 2016-07-15 DIAGNOSIS — L03116 Cellulitis of left lower limb: Secondary | ICD-10-CM

## 2016-07-15 DIAGNOSIS — F17200 Nicotine dependence, unspecified, uncomplicated: Secondary | ICD-10-CM | POA: Insufficient documentation

## 2016-07-15 MED ORDER — CEPHALEXIN 500 MG PO CAPS: 500.0000 mg | ORAL_CAPSULE | Freq: Four times a day (QID) | ORAL | 0 refills | Status: AC

## 2016-07-15 MED ORDER — CEPHALEXIN 500 MG PO CAPS: 500.0000 mg | ORAL_CAPSULE | Freq: Four times a day (QID) | ORAL | 0 refills | Status: DC

## 2016-07-15 MED ORDER — LIDOCAINE-EPINEPHRINE-TETRACAINE (LET) SOLUTION
3.0000 mL | Freq: Once | NASAL | Status: AC
  Administered 2016-07-15: 3 mL via TOPICAL
  Filled 2016-07-15: qty 3

## 2016-07-15 NOTE — Discharge Instructions (Signed)
Add the antibiotics prescribed. Keep the area clean and dry.  Return to the ER for wound check in 2 days.

## 2016-07-15 NOTE — ED Notes (Signed)
ED Provider at bedside. 

## 2016-07-15 NOTE — ED Provider Notes (Signed)
[facility]   CSN: [id number] Arrival date & time: 07/15/16  1733  By signing my name below, I, Cynda AcresHailei Fulton, attest that this documentation has been prepared under the direction and in the presence of Derwood KaplanNanavati, Traye Bates, MD. Electronically Signed: Cynda AcresHailei Fulton, Scribe. 07/15/16. 6:23 PM.  History   Chief Complaint Chief Complaint  Patient presents with  . Wound Check   HPI Comments: Shelly Washington is a 51 y.o. female with no pertinent past medical history, who presents to the Emergency Department complaining of sudden-onset, constant left lower leg pain s/p insect bite that occurred 5 days ago. Patient states Shelly Washington may have been bit by a spider while cleaning someone else's house 5 days ago. Patient reports gradually worsening symptoms, Shelly Washington was advised to return if her symptoms do not improve. Patient reports being evaluated here 4 days ago, Shelly Washington was diagnosed with cellulitis and prescribed bactrim. Patient reports attempting to "pop" the area with no relief. Patient denies any discharge from the area. Patient reports associated swelling, warmness, and itching. Patient reports taking prescribed antibiotics, applying ice. Patient reports a history of abscesses, no history of MRSA. Patient denies any fever, chills, nausea, vomiting, or any additional symptoms.   The history is provided by the patient. No language interpreter was used.    Past Medical History:  Diagnosis Date  . Cholesterol serum increased     Patient Active Problem List   Diagnosis Date Noted  . Right ankle injury 10/12/2011    Past Surgical History:  Procedure Laterality Date  . DILATION AND CURETTAGE OF UTERUS      OB History    Gravida Para Term Preterm AB Living   3 2 2   1 3    SAB TAB Ectopic Multiple Live Births   1 0   1         Home Medications    Prior to Admission medications   Medication Sig Start Date End Date Taking? Authorizing Provider  cephALEXin (KEFLEX) 500 MG  capsule Take 1 capsule (500 mg total) by mouth 4 (four) times daily. 07/15/16   Derwood KaplanNanavati, Javares Kaufhold, MD  sulfamethoxazole-trimethoprim (BACTRIM DS,SEPTRA DS) 800-160 MG tablet Take 1 tablet by mouth 2 (two) times daily. 07/11/16 07/18/16  Renne CriglerGeiple, Joshua, PA-C    Family History Family History  Problem Relation Age of Onset  . Hypertension Mother   . Diabetes Neg Hx   . Heart attack Neg Hx   . Hyperlipidemia Neg Hx   . Sudden death Neg Hx     Social History Social History  Substance Use Topics  . Smoking status: Current Every Day Smoker    Packs/day: 0.50  . Smokeless tobacco: Never Used  . Alcohol use Yes     Comment: social     Allergies   Patient has no known allergies.   Review of Systems Review of Systems  Constitutional: Negative for chills and fever.  Gastrointestinal: Negative for nausea and vomiting.  Musculoskeletal: Positive for arthralgias (left lower leg) and joint swelling (left lower leg).     Physical Exam Updated Vital Signs BP 98/67 (BP Location: Right Arm)   Pulse 71   Temp 98.2 F (36.8 C) (Oral)   Resp 16   Ht 5\' 6"  (1.676 m)   Wt 106.1 kg (234 lb)   SpO2 100%   BMI 37.77 kg/m   Physical Exam  Constitutional: Shelly Washington is oriented to person, place, and time. Shelly Washington appears well-developed and well-nourished. No distress.  HENT:  Head: Normocephalic and atraumatic.  Right Ear: Hearing normal.  Left Ear: Hearing normal.  Nose: Nose normal.  Mouth/Throat: Oropharynx is clear and moist and mucous membranes are normal.  Eyes: Conjunctivae and EOM are normal. Pupils are equal, round, and reactive to light.  Neck: Normal range of motion. Neck supple.  Cardiovascular: Normal rate, regular rhythm, S1 normal and S2 normal.   Pulmonary/Chest: Effort normal and breath sounds normal.  Abdominal: Normal appearance. There is no hepatosplenomegaly. There is no tenderness at McBurney's point and negative Murphy's sign.  Musculoskeletal: Normal range of motion. Shelly Washington  exhibits no edema, tenderness or deformity.  Neurological: Shelly Washington is alert and oriented to person, place, and time. Shelly Washington has normal strength. GCS eye subscore is 4. GCS verbal subscore is 5. GCS motor subscore is 6.  Skin: Skin is warm, dry and intact. There is erythema. No cyanosis. No pallor.  Left lower leg there is a 6x5 cm area of erythema 6x5 area with warmth to touch. There is tenderness surrounding the lesion with palpation.   Psychiatric: Shelly Washington has a normal mood and affect. Her speech is normal.  Nursing note and vitals reviewed.          ED Treatments / Results  DIAGNOSTIC STUDIES: Oxygen Saturation is 100% on RA, normal by my interpretation.    COORDINATION OF CARE: 6:22 PM Discussed treatment plan with pt at bedside and pt agreed to plan, which includes numbing the area.   Labs (all labs ordered are listed, but only abnormal results are displayed) Labs Reviewed - No data to display  EKG  EKG Interpretation None       Radiology No results found.  Procedures Procedures (including critical care time)  ULTRASOUND LIMITED SOFT TISSUE/ MUSCULOSKELETAL: left leg swelling Indication: evaluation for abscess Linear probe used to evaluate area of interest in two planes. Findings:  Pt has 0.5 cm x 0.75 cm area of fluid pocket, and cobblestoning Performed by: Dr Rhunette Croft Images saved electronically   Medications Ordered in ED Medications  lidocaine-EPINEPHrine-tetracaine (LET) solution (3 mLs Topical Given 07/15/16 1850)     Initial Impression / Assessment and Plan / ED Course  I have reviewed the triage vital signs and the nursing notes.  Pertinent labs & imaging results that were available during my care of the patient were reviewed by me and considered in my medical decision making (see chart for details).     Pt comes in with pain in her leg and swelling. Shelly Washington has cellulitis for sure, and there was concern for developing abscess. Pt has tried to squeeze the area  and has had clear liquid drainage. US showed one area that was concerning of abscess - small site, but pt declined incision and drainage.  Patient doesn't want incision and drainage against medical advice. Patient understands that her actions will lead to inadequate medical workup, and that Shelly Washington is at risk of complications of missed diagnosis, which includes morbidity and mortality.  Alternative options discussed of broadening the antibiotic coverage and return for wound evaluation Opportunity to change mind given Discussion witnessed by RN Patient is demonstrating good capacity to make decision. Patient understands that Shelly Washington needs to return to the ER immediately if her symptoms get worse. Shelly Washington will return to the ER in 2 days for wound recheck at [facility] urgent care.  Final Clinical Impressions(s) / ED Diagnoses   Final diagnoses:  Cellulitis of left lower extremity    New Prescriptions Discharge Medication List as of 07/15/2016  8:02 PM  START taking these medications   Details  cephALEXin (KEFLEX) 500 MG capsule Take 1 capsule (500 mg total) by mouth 4 (four) times daily., Starting Thu 07/15/2016, Print       I personally performed the services described in this documentation, which was scribed in my presence. The recorded information has been reviewed and is accurate.    Derwood Kaplan, MD 07/15/16 [phone]

## 2016-07-15 NOTE — ED Triage Notes (Signed)
Pt seen in Sunday for a spider bite, states not getting better

## 2019-05-15 ENCOUNTER — Emergency Department (HOSPITAL_BASED_OUTPATIENT_CLINIC_OR_DEPARTMENT_OTHER): Payer: 59

## 2019-05-15 ENCOUNTER — Other Ambulatory Visit: Payer: Self-pay

## 2019-05-15 ENCOUNTER — Encounter (HOSPITAL_BASED_OUTPATIENT_CLINIC_OR_DEPARTMENT_OTHER): Payer: Self-pay | Admitting: *Deleted

## 2019-05-15 ENCOUNTER — Emergency Department (HOSPITAL_BASED_OUTPATIENT_CLINIC_OR_DEPARTMENT_OTHER)
Admission: EM | Admit: 2019-05-15 | Discharge: 2019-05-15 | Disposition: A | Discharge status: Home / Self Care | Payer: 59 | Attending: Emergency Medicine | Admitting: Emergency Medicine

## 2019-05-15 DIAGNOSIS — F17210 Nicotine dependence, cigarettes, uncomplicated: Secondary | ICD-10-CM | POA: Insufficient documentation

## 2019-05-15 DIAGNOSIS — Y999 Unspecified external cause status: Secondary | ICD-10-CM | POA: Insufficient documentation

## 2019-05-15 DIAGNOSIS — Y9389 Activity, other specified: Secondary | ICD-10-CM | POA: Insufficient documentation

## 2019-05-15 DIAGNOSIS — X509XXA Other and unspecified overexertion or strenuous movements or postures, initial encounter: Secondary | ICD-10-CM | POA: Diagnosis not present

## 2019-05-15 DIAGNOSIS — Y9289 Other specified places as the place of occurrence of the external cause: Secondary | ICD-10-CM | POA: Diagnosis not present

## 2019-05-15 DIAGNOSIS — M25562 Pain in left knee: Secondary | ICD-10-CM | POA: Insufficient documentation

## 2019-05-15 DIAGNOSIS — M25462 Effusion, left knee: Secondary | ICD-10-CM | POA: Diagnosis not present

## 2019-05-15 DIAGNOSIS — S8992XA Unspecified injury of left lower leg, initial encounter: Secondary | ICD-10-CM | POA: Diagnosis present

## 2019-05-15 MED ORDER — LIDOCAINE 5 % EX PTCH: 1.0000 | MEDICATED_PATCH | CUTANEOUS | 0 refills | Status: AC

## 2019-05-15 MED ORDER — ACETAMINOPHEN 500 MG PO TABS: 500.0000 mg | ORAL_TABLET | Freq: Four times a day (QID) | ORAL | 0 refills | Status: AC | PRN

## 2019-05-15 MED ORDER — KETOROLAC TROMETHAMINE 60 MG/2ML IM SOLN
60.0000 mg | Freq: Once | INTRAMUSCULAR | Status: AC
  Administered 2019-05-15: 60 mg via INTRAMUSCULAR
  Filled 2019-05-15: qty 2

## 2019-05-15 NOTE — Discharge Instructions (Signed)
Please follow up with orthopedics, call their number tomorrow morning. Make sure to use ice/heat on the knee, take Tylenol, knee brace and crutches as needed . Elevate the knee. Return to the ER if your symptoms worsen

## 2019-05-15 NOTE — ED Triage Notes (Signed)
Left knee pain for an hour. She twisted her knee while getting into her car.

## 2019-05-16 NOTE — ED Provider Notes (Signed)
[facility] EMERGENCY DEPARTMENT Provider Note   CSN: [id number] Arrival date & time: 05/15/19  1930     History Chief Complaint  Patient presents with  . Knee Injury    Shelly Washington is a 54 y.o. female.  HPI 54 year old female with no significant medical history presents to the ER after trying to get into her car when she turned and twisted her knee.  Patient states that when she did so she felt a pop in her knee with a sudden onset of severe pain.  Patient was able to ambulate for a few steps but had severe pain in had to sit down.  She was able to get into the car and drive to the ER.  She has not been able to bear weight since.  Notes mild swelling to left knee.  She denies any falls, no other joint pain or injuries.  Past Medical History:  Diagnosis Date  . Cholesterol serum increased     Patient Active Problem List   Diagnosis Date Noted  . Right ankle injury 10/12/2011    Past Surgical History:  Procedure Laterality Date  . DILATION AND CURETTAGE OF UTERUS       OB History    Gravida  3   Para  2   Term  2   Preterm      AB  1   Living  3     SAB  1   TAB  0   Ectopic      Multiple  1   Live Births              Family History  Problem Relation Age of Onset  . Hypertension Mother   . Diabetes Neg Hx   . Heart attack Neg Hx   . Hyperlipidemia Neg Hx   . Sudden death Neg Hx     Social History   Tobacco Use  . Smoking status: Current Every Day Smoker    Packs/day: 0.50  . Smokeless tobacco: Never Used  Substance Use Topics  . Alcohol use: Yes    Comment: social  . Drug use: No    Home Medications Prior to Admission medications   Medication Sig Start Date End Date Taking? Authorizing Provider  acetaminophen (TYLENOL) 500 MG tablet Take 1 tablet (500 mg total) by mouth every 6 (six) hours as needed. 05/15/19   Mare Ferrari, PA-C  cephALEXin (KEFLEX) 500 MG capsule Take 1 capsule (500 mg total) by mouth 4 (four)  times daily. 07/15/16   Derwood Kaplan, MD  lidocaine (LIDODERM) 5 % Place 1 patch onto the skin daily. Remove & Discard patch within 12 hours or as directed by MD 05/15/19   Mare Ferrari, PA-C    Allergies    Patient has no known allergies.  Review of Systems   Review of Systems  Musculoskeletal: Positive for arthralgias, gait problem and joint swelling. Negative for back pain, myalgias, neck pain and neck stiffness.  Skin: Negative for color change, rash and wound.    Physical Exam Updated Vital Signs BP 132/84 (BP Location: Right Arm)   Pulse 72   Temp 98.3 F (36.8 C) (Oral)   Resp 18   Ht 5\' 6"  (1.676 m)   Wt 103.9 kg   SpO2 99%   BMI 36.96 kg/m   Physical Exam Vitals and nursing note reviewed.  Constitutional:      General: She is not in acute distress.    Appearance: She  is well-developed. She is obese. She is not ill-appearing or diaphoretic.  HENT:     Head: Normocephalic and atraumatic.  Eyes:     Extraocular Movements: Extraocular movements intact.     Conjunctiva/sclera: Conjunctivae normal.     Pupils: Pupils are equal, round, and reactive to light.  Cardiovascular:     Rate and Rhythm: Normal rate and regular rhythm.     Pulses: Normal pulses.     Heart sounds: Normal heart sounds. No murmur.     Comments: 2+ DP pulses bilaterally Pulmonary:     Effort: Pulmonary effort is normal. No respiratory distress.     Breath sounds: Normal breath sounds.  Abdominal:     General: Abdomen is flat.     Palpations: Abdomen is soft.     Tenderness: There is no abdominal tenderness.  Musculoskeletal:        General: Tenderness present. No swelling, deformity or signs of injury.     Cervical back: Normal range of motion and neck supple. No tenderness.     Right knee: Normal.     Left knee: Effusion present. No deformity, erythema, ecchymosis, lacerations or bony tenderness. Decreased range of motion. Tenderness present over the medial joint line and patellar tendon.  Normal alignment.     Right lower leg: No edema.     Left lower leg: No edema.     Comments: Negative Lachman's.  Decreased range of motion secondary to pain.  Skin:    General: Skin is warm and dry.     Capillary Refill: Capillary refill takes less than 2 seconds.     Findings: No bruising, erythema or rash.  Neurological:     General: No focal deficit present.     Mental Status: She is alert and oriented to person, place, and time.     Sensory: No sensory deficit.     Motor: No weakness.     Coordination: Coordination normal.     Gait: Gait normal.  Psychiatric:        Mood and Affect: Mood normal.        Behavior: Behavior normal.     ED Results / Procedures / Treatments   Labs (all labs ordered are listed, but only abnormal results are displayed) Labs Reviewed - No data to display  EKG None  Radiology DG Knee Complete 4 Views Left  Result Date: 05/15/2019 CLINICAL DATA:  54 year old female with trauma to the left knee. EXAM: LEFT KNEE - COMPLETE 4+ VIEW COMPARISON:  Left knee radiograph dated 07/23/2018. FINDINGS: There is no acute fracture or dislocation. The bones are well mineralized. No arthritic changes. Small suprapatellar effusion. The soft tissues are unremarkable. IMPRESSION: No acute fracture or dislocation. Electronically Signed   By: Anner Crete M.D.   On: 05/15/2019 20:02    Procedures Procedures (including critical care time)  Medications Ordered in ED Medications  ketorolac (TORADOL) injection 60 mg (60 mg Intramuscular Given 05/15/19 2201)    ED Course  I have reviewed the triage vital signs and the nursing notes.  Pertinent labs & imaging results that were available during my care of the patient were reviewed by me and considered in my medical decision making (see chart for details).    MDM Rules/Calculators/A&P                     53 year old with left knee pain after twisting as she was trying to get into the car. Physical exam with  tenderness to  palpation to medial joint line.  Range of motion slightly decreased secondary to pain.  Negative Lachman's.  X-ray without evidence of acute fracture or dislocation.  Given mechanism of injury, concern remains for possible ligamentous tear.  The patient has no numbness or tingling, normal pulses, sensation intact.  There is no urgent need to MRI the patient's knee, I will provide a splint, crutches and have her follow-up with orthopedics in an outpatient setting for further evaluation.  Encouraged Tylenol/NSAIDs, prescribed Lidoderm patch for pain.  Patient voices understanding and is agreeable to this plan.  Return precautions given.  At this stage in the ED course, the patient has been adequately screened and is stable for discharge.  I discussed the case with Dr. Dalene Seltzer and she is agreeable to the above plan. Final Clinical Impression(s) / ED Diagnoses Final diagnoses:  Acute pain of left knee    Rx / DC Orders ED Discharge Orders         Ordered    Knee brace     05/15/19 2210    Crutches     05/15/19 2210    acetaminophen (TYLENOL) 500 MG tablet  Every 6 hours PRN     05/15/19 2210    lidocaine (LIDODERM) 5 %  Every 24 hours     05/15/19 2210           Mare Ferrari, PA-C 05/16/19 1723    Alvira Monday, MD 05/23/19 [phone]

## 2019-11-29 ENCOUNTER — Encounter (HOSPITAL_BASED_OUTPATIENT_CLINIC_OR_DEPARTMENT_OTHER): Payer: Self-pay | Admitting: *Deleted

## 2019-11-29 ENCOUNTER — Emergency Department (HOSPITAL_BASED_OUTPATIENT_CLINIC_OR_DEPARTMENT_OTHER)
Admission: EM | Admit: 2019-11-29 | Discharge: 2019-11-29 | Disposition: A | Discharge status: Home / Self Care | Payer: 59 | Attending: Emergency Medicine | Admitting: Emergency Medicine

## 2019-11-29 ENCOUNTER — Other Ambulatory Visit (HOSPITAL_BASED_OUTPATIENT_CLINIC_OR_DEPARTMENT_OTHER): Payer: Self-pay | Admitting: Emergency Medicine

## 2019-11-29 ENCOUNTER — Emergency Department (HOSPITAL_BASED_OUTPATIENT_CLINIC_OR_DEPARTMENT_OTHER): Payer: 59

## 2019-11-29 ENCOUNTER — Other Ambulatory Visit: Payer: Self-pay

## 2019-11-29 DIAGNOSIS — F17200 Nicotine dependence, unspecified, uncomplicated: Secondary | ICD-10-CM | POA: Diagnosis not present

## 2019-11-29 DIAGNOSIS — M25512 Pain in left shoulder: Secondary | ICD-10-CM | POA: Diagnosis not present

## 2019-11-29 DIAGNOSIS — M25552 Pain in left hip: Secondary | ICD-10-CM | POA: Diagnosis not present

## 2019-11-29 DIAGNOSIS — S7002XA Contusion of left hip, initial encounter: Secondary | ICD-10-CM | POA: Insufficient documentation

## 2019-11-29 DIAGNOSIS — Y92481 Parking lot as the place of occurrence of the external cause: Secondary | ICD-10-CM | POA: Insufficient documentation

## 2019-11-29 DIAGNOSIS — S39012A Strain of muscle, fascia and tendon of lower back, initial encounter: Secondary | ICD-10-CM | POA: Insufficient documentation

## 2019-11-29 DIAGNOSIS — M542 Cervicalgia: Secondary | ICD-10-CM | POA: Insufficient documentation

## 2019-11-29 DIAGNOSIS — S3992XA Unspecified injury of lower back, initial encounter: Secondary | ICD-10-CM | POA: Diagnosis present

## 2019-11-29 MED ORDER — METHOCARBAMOL 500 MG PO TABS: 500.0000 mg | ORAL_TABLET | Freq: Two times a day (BID) | ORAL | 0 refills | Status: AC

## 2019-11-29 MED ORDER — IBUPROFEN 400 MG PO TABS
600.0000 mg | ORAL_TABLET | Freq: Once | ORAL | Status: AC
  Administered 2019-11-29: 600 mg via ORAL
  Filled 2019-11-29: qty 1

## 2019-11-29 MED FILL — METHOCARBAMOL 500 MG TABS: 500 | 10 days supply | Qty: 20 | Fill #0

## 2019-11-29 NOTE — ED Notes (Signed)
ED Provider at bedside. 
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
## 2019-11-29 NOTE — ED Notes (Signed)
Patient transported to X-ray 

## 2019-11-29 NOTE — Discharge Instructions (Addendum)

## 2019-12-17 ENCOUNTER — Encounter (HOSPITAL_BASED_OUTPATIENT_CLINIC_OR_DEPARTMENT_OTHER): Payer: Self-pay | Admitting: Emergency Medicine

## 2019-12-17 ENCOUNTER — Emergency Department (HOSPITAL_BASED_OUTPATIENT_CLINIC_OR_DEPARTMENT_OTHER)
Admission: EM | Admit: 2019-12-17 | Discharge: 2019-12-17 | Disposition: A | Discharge status: Home / Self Care | Payer: 59 | Attending: Emergency Medicine | Admitting: Emergency Medicine

## 2019-12-17 ENCOUNTER — Other Ambulatory Visit: Payer: Self-pay

## 2019-12-17 DIAGNOSIS — J069 Acute upper respiratory infection, unspecified: Secondary | ICD-10-CM | POA: Diagnosis not present

## 2019-12-17 DIAGNOSIS — H9202 Otalgia, left ear: Secondary | ICD-10-CM | POA: Diagnosis not present

## 2019-12-17 DIAGNOSIS — F17200 Nicotine dependence, unspecified, uncomplicated: Secondary | ICD-10-CM | POA: Insufficient documentation

## 2019-12-17 DIAGNOSIS — U071 COVID-19: Secondary | ICD-10-CM | POA: Diagnosis not present

## 2019-12-17 DIAGNOSIS — R059 Cough, unspecified: Secondary | ICD-10-CM | POA: Diagnosis present

## 2019-12-17 LAB — RESP PANEL BY RT-PCR (FLU A&B, COVID) ARPGX2
Influenza A by PCR: NEGATIVE
Influenza B by PCR: NEGATIVE
SARS Coronavirus 2 by RT PCR: POSITIVE — AB

## 2019-12-17 NOTE — ED Provider Notes (Signed)
[facility] EMERGENCY DEPARTMENT Provider Note   CSN: [id number] Arrival date & time: 12/17/19  1816     History Chief Complaint  Patient presents with  . Facial Pain    covid +    Shelly Washington is a 54 y.o. female.  Facial pressure for 3 to 4 days, runny nose congestion, ear pain.  Left, recurrent.  History of ear infections.  No fevers chills.  Tolerating p.o.  Normal work of breathing.  No sick contacts, no Covid vaccine        Past Medical History:  Diagnosis Date  . Cholesterol serum increased     Patient Active Problem List   Diagnosis Date Noted  . Right ankle injury 10/12/2011    Past Surgical History:  Procedure Laterality Date  . DILATION AND CURETTAGE OF UTERUS       OB History    Gravida  3   Para  2   Term  2   Preterm      AB  1   Living  3     SAB  1   TAB  0   Ectopic      Multiple  1   Live Births              Family History  Problem Relation Age of Onset  . Hypertension Mother   . Diabetes Neg Hx   . Heart attack Neg Hx   . Hyperlipidemia Neg Hx   . Sudden death Neg Hx     Social History   Tobacco Use  . Smoking status: Current Every Day Smoker    Packs/day: 0.50  . Smokeless tobacco: Never Used  Substance Use Topics  . Alcohol use: Yes    Comment: social  . Drug use: No    Home Medications Prior to Admission medications   Medication Sig Start Date End Date Taking? Authorizing Provider  acetaminophen (TYLENOL) 500 MG tablet Take 1 tablet (500 mg total) by mouth every 6 (six) hours as needed. 05/15/19   Mare Ferrari, PA-C  cephALEXin (KEFLEX) 500 MG capsule Take 1 capsule (500 mg total) by mouth 4 (four) times daily. 07/15/16   Derwood Kaplan, MD  lidocaine (LIDODERM) 5 % Place 1 patch onto the skin daily. Remove & Discard patch within 12 hours or as directed by MD 05/15/19   Mare Ferrari, PA-C  methocarbamol (ROBAXIN) 500 MG tablet Take 1 tablet (500 mg total) by mouth 2 (two) times daily.  11/29/19   Gailen Shelter, PA    Allergies    Patient has no known allergies.  Review of Systems   Review of Systems  Constitutional: Negative for chills and fever.  HENT: Positive for congestion, ear pain, rhinorrhea, sinus pressure and sinus pain. Negative for ear discharge.   Respiratory: Negative for cough and shortness of breath.   Cardiovascular: Negative for chest pain and palpitations.  Gastrointestinal: Negative for diarrhea, nausea and vomiting.  Genitourinary: Negative for difficulty urinating and dysuria.  Musculoskeletal: Negative for arthralgias and back pain.  Skin: Negative for rash and wound.  Neurological: Negative for light-headedness and headaches.    Physical Exam Updated Vital Signs Pulse 95   Temp 98.9 F (37.2 C) (Oral)   Resp 16   Ht 5\' 6"  (1.676 m)   Wt 104.3 kg   SpO2 100%   BMI 37.12 kg/m   Physical Exam Vitals and nursing note reviewed. Exam conducted with a chaperone present.  Constitutional:  General: She is not in acute distress.    Appearance: Normal appearance.  HENT:     Head: Normocephalic and atraumatic.     Right Ear: Tympanic membrane and ear canal normal. There is no impacted cerumen.     Left Ear: Tympanic membrane and ear canal normal. There is no impacted cerumen.     Nose: No rhinorrhea.  Eyes:     General:        Right eye: No discharge.        Left eye: No discharge.     Conjunctiva/sclera: Conjunctivae normal.  Cardiovascular:     Rate and Rhythm: Normal rate and regular rhythm.  Pulmonary:     Effort: Pulmonary effort is normal. No respiratory distress.     Breath sounds: No stridor. No wheezing or rales.  Abdominal:     General: Abdomen is flat. There is no distension.     Palpations: Abdomen is soft.  Musculoskeletal:        General: No tenderness or signs of injury.  Skin:    General: Skin is warm and dry.     Capillary Refill: Capillary refill takes less than 2 seconds.  Neurological:     General: No  focal deficit present.     Mental Status: She is alert. Mental status is at baseline.     Motor: No weakness.  Psychiatric:        Mood and Affect: Mood normal.        Behavior: Behavior normal.     ED Results / Procedures / Treatments   Labs (all labs ordered are listed, but only abnormal results are displayed) Labs Reviewed  RESP PANEL BY RT-PCR (FLU A&B, COVID) ARPGX2 - Abnormal; Notable for the following components:      Result Value   SARS Coronavirus 2 by RT PCR POSITIVE (*)    All other components within normal limits    EKG None  Radiology No results found.  Procedures Procedures (including critical care time)  Medications Ordered in ED Medications - No data to display  ED Course  I have reviewed the triage vital signs and the nursing notes.  Pertinent labs & imaging results that were available during my care of the patient were reviewed by me and considered in my medical decision making (see chart for details).    MDM Rules/Calculators/A&P                          Covid positive, mild URI type symptoms.  No signs of deep space bacterial infection safe for outpatient management.  Patient does not qualify for MAB infusion.  Strict return precautions given. Final Clinical Impression(s) / ED Diagnoses Final diagnoses:  Upper respiratory tract infection, unspecified type  Acute otalgia, left  COVID    Rx / DC Orders ED Discharge Orders    None       Sabino Donovan, MD 12/17/19 2335

## 2019-12-17 NOTE — ED Triage Notes (Signed)
Pt having sinus pain, nasal drainage and left ear pain since Friday.

## 2019-12-17 NOTE — Discharge Instructions (Addendum)
You can take 600 mg of ibuprofen every 6 hours, you can take 1000 mg of Tylenol every 6 hours, you can alternate these every 3 or you can take them together.
